# Patient Record
Sex: Male | Born: 1995 | Race: White | Hispanic: No | Marital: Single | Smoking: Current every day smoker
Health system: Southern US, Community
[De-identification: ages and names within clinical notes are randomized; demographics above are authoritative.]

---
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
## 2014-09-09 IMAGING — CT CT STONE STUDY
1 of 4 series · 6 of 46 positions shown, 11 images · non-contrast
Comparison: No priors.

CLINICAL DATA: Flank pain.  Vomiting.

EXAM:
CT ABDOMEN AND PELVIS WITHOUT CONTRAST
TECHNIQUE: Multidetector CT imaging of the abdomen and pelvis was performed
following the standard protocol without IV contrast.

[Series 4: lung · axial · 0.65mm/px · z∈[-806,-706]mm · 6 of 30 slices shown, 11 images]
[im 5/30  soft-tissue]
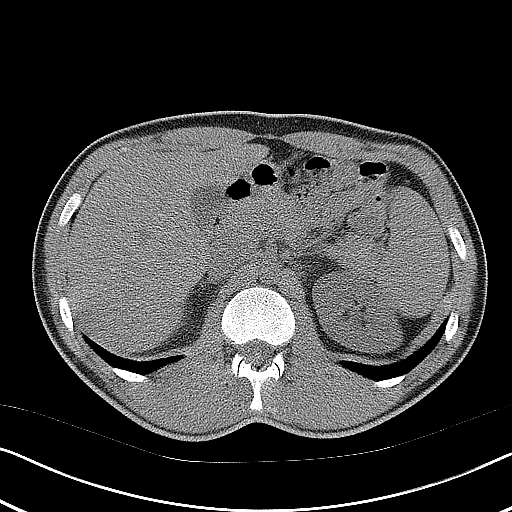
[im 5/30  bone]
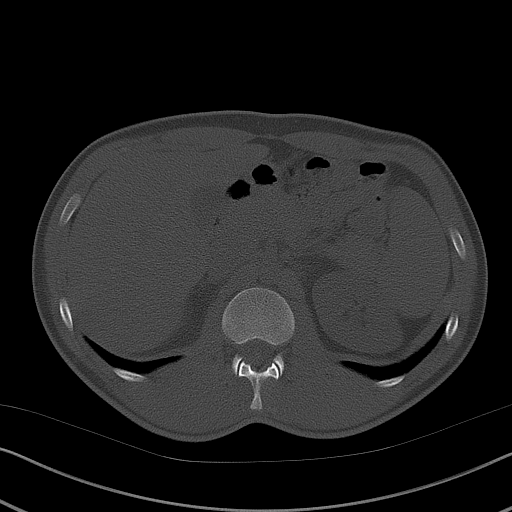
[im 9/30  soft-tissue]
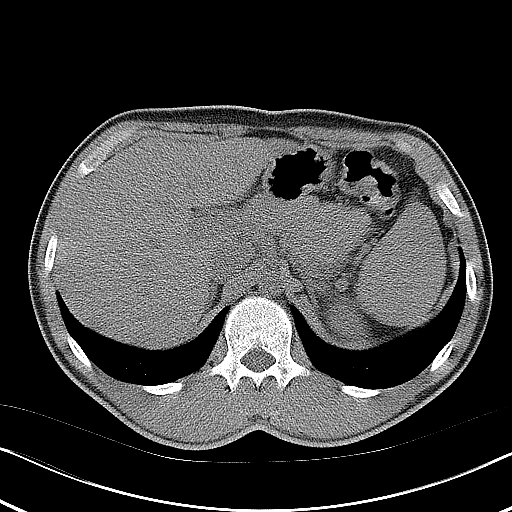
[im 13/30  soft-tissue]
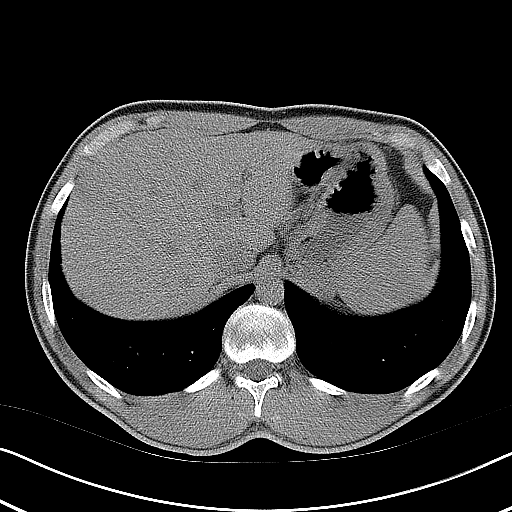
[im 13/30  lung]
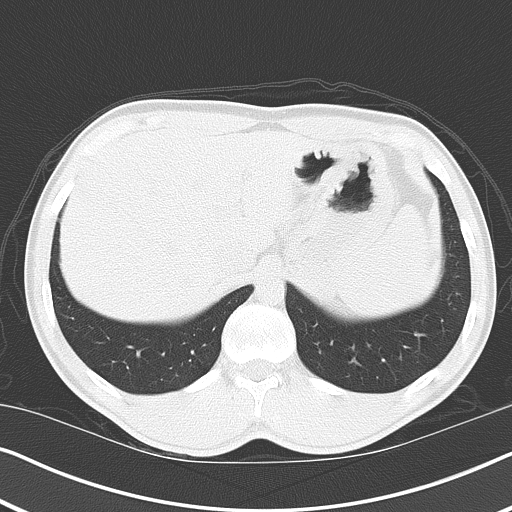
[im 17/30  soft-tissue]
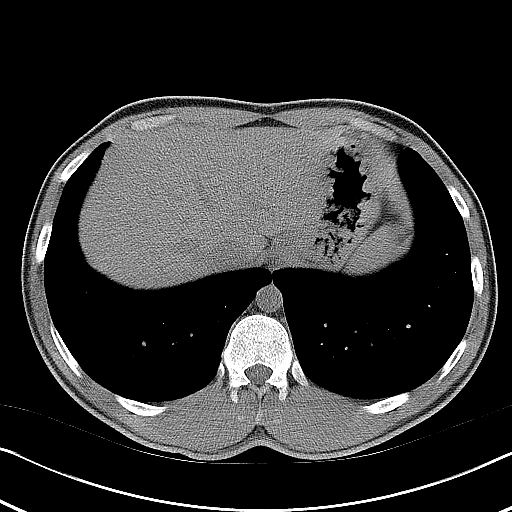
[im 17/30  lung]
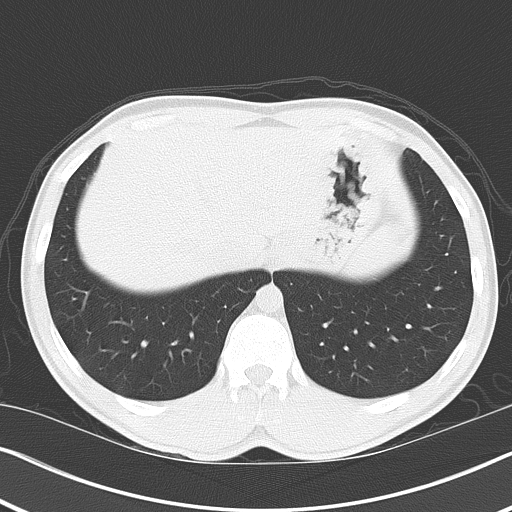
[im 21/30  soft-tissue]
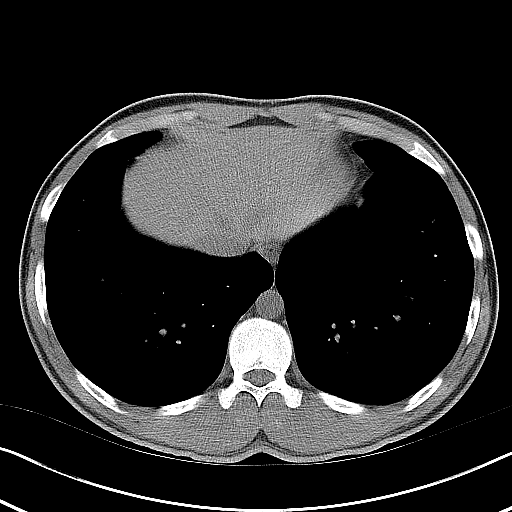
[im 21/30  lung]
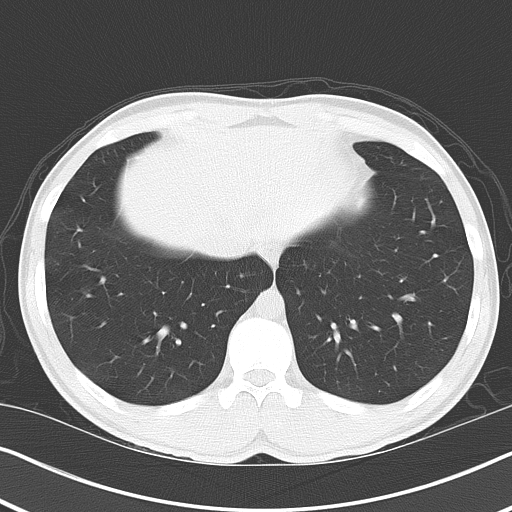
[im 25/30  soft-tissue]
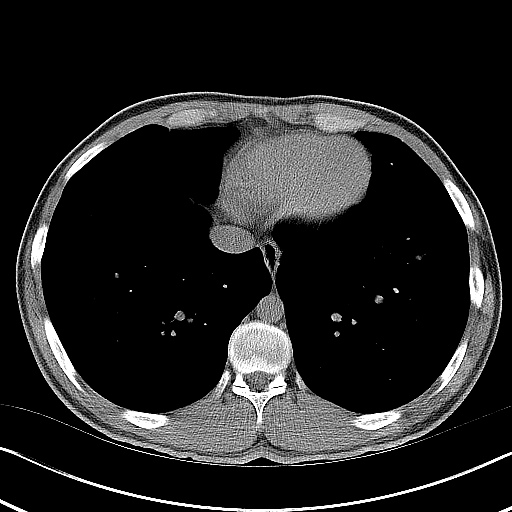
[im 25/30  lung]
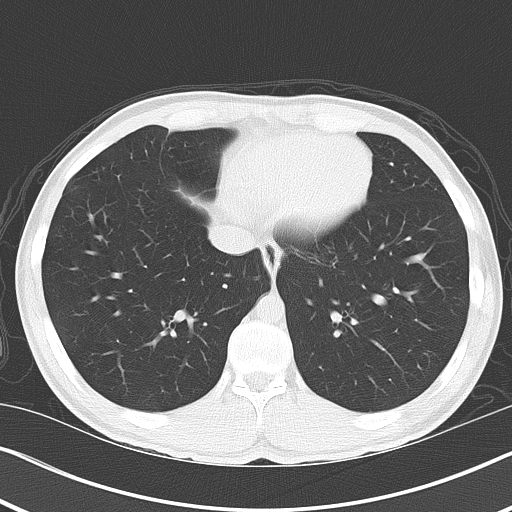

[6 of 46 positions shown; findings below may reference images not displayed]

FINDINGS: Lung Bases: Unremarkable.

Abdomen/Pelvis: In the upper pole collecting system of the left
kidney there is a 2 mm nonobstructive calculus. No additional
calculi are identified within the collecting system of the right
kidney, along the course of either ureter, or within the lumen of
the urinary bladder. No hydroureteronephrosis or perinephric
stranding to suggest urinary tract obstruction at this time. The
unenhanced appearance of the kidneys is otherwise unremarkable
bilaterally.

The unenhanced appearance of the liver, gallbladder, pancreas,
spleen and bilateral adrenal glands is unremarkable. No significant
volume of ascites. No pneumoperitoneum. No pathologic distention of
small bowel. No definite lymphadenopathy identified within the
abdomen or pelvis on today's non contrast CT examination.

Musculoskeletal: There are no aggressive appearing lytic or blastic
lesions noted in the visualized portions of the skeleton.
IMPRESSION: 1. 2 mm nonobstructive calculus in the upper pole collecting system
of the left kidney.
2. No ureteral stones or findings of urinary tract obstruction at
this time.
3. No acute findings in the abdomen or pelvis.

## 2014-10-20 ENCOUNTER — Emergency Department
Admission: EM | Admit: 2014-10-20 | Discharge: 2014-10-20 | Disposition: A | Discharge status: Other Acute Inpatient Hospital | Payer: Managed Care, Other (non HMO) | Attending: Emergency Medicine | Admitting: Emergency Medicine

## 2014-10-20 ENCOUNTER — Encounter: Payer: Self-pay | Admitting: Emergency Medicine

## 2014-10-20 DIAGNOSIS — F1210 Cannabis abuse, uncomplicated: Secondary | ICD-10-CM

## 2014-10-20 DIAGNOSIS — F1994 Other psychoactive substance use, unspecified with psychoactive substance-induced mood disorder: Secondary | ICD-10-CM | POA: Diagnosis not present

## 2014-10-20 DIAGNOSIS — F39 Unspecified mood [affective] disorder: Secondary | ICD-10-CM | POA: Insufficient documentation

## 2014-10-20 DIAGNOSIS — F1410 Cocaine abuse, uncomplicated: Secondary | ICD-10-CM | POA: Diagnosis not present

## 2014-10-20 DIAGNOSIS — Z008 Encounter for other general examination: Secondary | ICD-10-CM | POA: Diagnosis present

## 2014-10-20 DIAGNOSIS — R45851 Suicidal ideations: Secondary | ICD-10-CM | POA: Diagnosis not present

## 2014-10-20 DIAGNOSIS — Z720 Tobacco use: Secondary | ICD-10-CM | POA: Diagnosis not present

## 2014-10-20 LAB — CBC
HCT: 42.8 % (ref 40.0–52.0)
Hemoglobin: 15 g/dL (ref 13.0–18.0)
MCH: 32.5 pg (ref 26.0–34.0)
MCHC: 35 g/dL (ref 32.0–36.0)
MCV: 93 fL (ref 80.0–100.0)
Platelets: 256 10*3/uL (ref 150–440)
RBC: 4.61 MIL/uL (ref 4.40–5.90)
RDW: 12.4 % (ref 11.5–14.5)
WBC: 5.8 10*3/uL (ref 3.8–10.6)

## 2014-10-20 LAB — COMPREHENSIVE METABOLIC PANEL
ALT: 11 U/L — ABNORMAL LOW (ref 17–63)
AST: 18 U/L (ref 15–41)
Albumin: 4.7 g/dL (ref 3.5–5.0)
Alkaline Phosphatase: 49 U/L (ref 38–126)
Anion gap: 7 (ref 5–15)
BUN: 11 mg/dL (ref 6–20)
CO2: 28 mmol/L (ref 22–32)
Calcium: 9.7 mg/dL (ref 8.9–10.3)
Chloride: 106 mmol/L (ref 101–111)
Creatinine, Ser: 0.93 mg/dL (ref 0.61–1.24)
GFR calc Af Amer: >60 mL/min (ref >60)
GFR calc non Af Amer: >60 mL/min (ref >60)
Glucose, Bld: 100 mg/dL — ABNORMAL HIGH (ref 65–99)
Potassium: 4.4 mmol/L (ref 3.5–5.1)
Sodium: 141 mmol/L (ref 135–145)
Total Bilirubin: 0.9 mg/dL (ref 0.3–1.2)
Total Protein: 8 g/dL (ref 6.5–8.1)

## 2014-10-20 LAB — URINE DRUG SCREEN, QUALITATIVE (ARMC ONLY)
Amphetamines, Ur Screen: NOT DETECTED
Barbiturates, Ur Screen: NOT DETECTED
Benzodiazepine, Ur Scrn: NOT DETECTED
Cannabinoid 50 Ng, Ur ~~LOC~~: POSITIVE — AB
Cocaine Metabolite,Ur ~~LOC~~: POSITIVE — AB
MDMA (Ecstasy)Ur Screen: NOT DETECTED
Methadone Scn, Ur: NOT DETECTED
Opiate, Ur Screen: NOT DETECTED
Phencyclidine (PCP) Ur S: NOT DETECTED
Tricyclic, Ur Screen: NOT DETECTED

## 2014-10-20 LAB — ACETAMINOPHEN LEVEL: Acetaminophen (Tylenol), Serum: <10 ug/mL — ABNORMAL LOW (ref 10–30)

## 2014-10-20 LAB — SALICYLATE LEVEL: Salicylate Lvl: <4 mg/dL (ref 2.8–30.0)

## 2014-10-20 LAB — ETHANOL: Alcohol, Ethyl (B): <5 mg/dL (ref <5)

## 2014-10-20 MED ORDER — NICOTINE 21 MG/24HR TD PT24
21.0000 mg | MEDICATED_PATCH | Freq: Every day | TRANSDERMAL | Status: DC
  Administered 2014-10-20: 21 mg via TRANSDERMAL
  Filled 2014-10-20: qty 1

## 2014-10-20 NOTE — ED Notes (Signed)
Pt being discharge per MD orders.

## 2014-10-20 NOTE — ED Notes (Signed)
Pt mom took his wallet and car keys and cell phone home with her at time of triage.

## 2014-10-20 NOTE — ED Notes (Signed)
BEHAVIORAL HEALTH ROUNDING Patient sleeping: No. Patient alert and oriented: yes Behavior appropriate: Yes.  ; If no, describe:  Nutrition and fluids offered: Yes  and Pt has not been evaluated by EDP. Toileting and hygiene offered: Yes  Sitter present: yes Law enforcement present: Yes ODS   Pt has 1:1 sitter at this time d/t vocalizing SI.

## 2014-10-20 NOTE — BHH Counselor (Signed)
Writer received a call from Pt's father, Jenetta DownerJohn Johnstone [phone]), wanting to know if his son was in the ER and information about him.  Writer has not received consent from Pt on talking to his father, so he was given no information.  Writer informed Pt's nurse, Aundra MilletMegan, RN to let the Pt know his father was asking about him.   Anne ShutterHannah Fontella Shan, LPCA Therapeutic Triage Specialist

## 2014-10-20 NOTE — ED Notes (Signed)

## 2014-10-20 NOTE — ED Notes (Signed)
BEHAVIORAL HEALTH ROUNDING Patient sleeping: No. Patient alert and oriented: yes Behavior appropriate: Yes.  ; If no, describe:  Nutrition and fluids offered: Yes  Toileting and hygiene offered: Yes  Sitter present: yes Law enforcement present: No 

## 2014-10-20 NOTE — ED Notes (Signed)
Patient assigned to appropriate care area. Patient oriented to unit/care area: Informed that, for their safety, care areas are designed for safety and monitored by security cameras at all times; and visiting hours explained to patient. Patient verbalizes understanding, and verbal contract for safety obtained. 
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
## 2014-10-20 NOTE — ED Notes (Signed)

## 2014-10-20 NOTE — ED Notes (Signed)
Pt states that he is here today "to get clean" pt states he is using approx .5-1gram of cocaine 4 times a week for the past year. Pt states he "uses to feel numb". Pt states that he has thoughts about hurting himself with no particular plan. NAD noted at time of assessment. Pt calm and cooperative with staff, some anxiety noted on assessment.

## 2014-10-20 NOTE — ED Notes (Signed)
NAD noted at this time. Pt resting in bed with his eyes closed, awakens with mild stimuli. Respirations are noted to be even and unlabored at this time. Will continue with 15 min safety checks.

## 2014-10-20 NOTE — Discharge Instructions (Signed)
Stimulant Use Disorder-Cocaine  Cocaine is one of a group of powerful drugs called stimulants. Cocaine has medical uses for stopping nosebleeds and for pain control before minor nose or dental surgery. However, cocaine is misused because of the effects that it produces. These effects include:   · A feeling of extreme pleasure.  · Alertness.  · High energy.  Common street names for cocaine include coke, crack, blow, snow, and nose candy. Cocaine is snorted, dissolved in water and injected, or smoked.   Stimulants are addictive because they activate regions of the brain that produce both the pleasurable sensation of "reward" and psychological dependence. Together, these actions account for loss of control and the rapid development of drug dependence. This means you become ill without the drug (withdrawal) and need to keep using it to function.   Stimulant use disorder is use of stimulants that disrupts your daily life. It disrupts relationships with family and friends and how you do your job. Cocaine increases your blood pressure and heart rate. It can cause a heart attack or stroke. Cocaine can also cause death from irregular heart rate or seizures.  SYMPTOMS  Symptoms of stimulant use disorder with cocaine include:  · Use of cocaine in larger amounts or over a longer period of time than intended.  · Unsuccessful attempts to cut down or control cocaine use.  · A lot of time spent obtaining, using, or recovering from the effects of cocaine.  · A strong desire or urge to use cocaine (craving).  · Continued use of cocaine in spite of major problems at work, school, or home because of use.  · Continued use of cocaine in spite of relationship problems because of use.  · Giving up or cutting down on important life activities because of cocaine use.  · Use of cocaine over and over in situations when it is physically hazardous, such as driving a car.  · Continued use of cocaine in spite of a physical problem that is likely  related to use. Physical problems can include:  ¨ Malnutrition.  ¨ Nosebleeds.  ¨ Chest pain.  ¨ High blood pressure.  ¨ A hole that develops between the part of your nose that separates your nostrils (perforated nasal septum).  ¨ Lung and kidney damage.  · Continued use of cocaine in spite of a mental problem that is likely related to use. Mental problems can include:  ¨ Schizophrenia-like symptoms.  ¨ Depression.  ¨ Bipolar mood swings.  ¨ Anxiety.  ¨ Sleep problems.  · Need to use more and more cocaine to get the same effect, or lessened effect over time with use of the same amount of cocaine (tolerance).  · Having withdrawal symptoms when cocaine use is stopped, or using cocaine to reduce or avoid withdrawal symptoms. Withdrawal symptoms include:  ¨ Depressed or irritable mood.  ¨ Low energy or restlessness.  ¨ Bad dreams.  ¨ Poor or excessive sleep.  ¨ Increased appetite.  DIAGNOSIS  Stimulant use disorder is diagnosed by your health care provider. You may be asked questions about your cocaine use and how it affects your life. A physical exam may be done. A drug screen may be ordered. You may be referred to a mental health professional. The diagnosis of stimulant use disorder requires at least two symptoms within 12 months. The type of stimulant use disorder depends on the number of signs and symptoms you have. The type may be:  · Mild. Two or three signs and symptoms.  ·   Moderate. Four or five signs and symptoms.  · Severe. Six or more signs and symptoms.  TREATMENT  Treatment for stimulant use disorder is usually provided by mental health professionals with training in substance use disorders. The following options are available:  · Counseling or talk therapy. Talk therapy addresses the reasons you use cocaine and ways to keep you from using again. Goals of talk therapy include:  ¨ Identifying and avoiding triggers for use.  ¨ Handling cravings.  ¨ Replacing use with healthy activities.  · Support groups.  Support groups provide emotional support, advice, and guidance.  · Medicine. Certain medicines may decrease cocaine cravings or withdrawal symptoms.  HOME CARE INSTRUCTIONS  · Take medicines only as directed by your health care provider.  · Identify the people and activities that trigger your cocaine use and avoid them.  · Keep all follow-up visits as directed by your health care provider.  SEEK MEDICAL CARE IF:  · Your symptoms get worse or you relapse.  · You are not able to take medicines as directed.  SEEK IMMEDIATE MEDICAL CARE IF:  · You have serious thoughts about hurting yourself or others.  · You have a seizure, chest pain, sudden weakness, or loss of speech or vision.  FOR MORE INFORMATION  · National Institute on Drug Abuse: www.drugabuse.gov  · Substance Abuse and Mental Health Services Administration: www.samhsa.gov     This information is not intended to replace advice given to you by your health care provider. Make sure you discuss any questions you have with your health care provider.     Document Released: 12/23/1999 Document Revised: 01/15/2014 Document Reviewed: 01/07/2013  [employer]vier Interactive Patient Education ©2016 [employer]
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
## 2014-10-20 NOTE — ED Notes (Signed)
Pt to discharge to home  

## 2014-10-20 NOTE — ED Notes (Signed)
Pt transferred into ED BHU room 2    Patient assigned to appropriate care area. Patient oriented to unit/care area: Informed that, for their safety, care areas are designed for safety and monitored by security cameras at all times; Visiting hours and phone times explained to patient. Patient verbalizes understanding, and verbal contract for safety obtained.    Assessment completed  - he denies pain

## 2014-10-20 NOTE — ED Notes (Signed)
ED BHU PLACEMENT JUSTIFICATION Is the patient under IVC or is there intent for IVC: Yes.   Is the patient medically cleared: Yes.   Is there vacancy in the ED BHU: Yes.   Is the population mix appropriate for patient: Yes.   Is the patient awaiting placement in inpatient or outpatient setting: Yes.? Placement or discharge to home     Has the patient had a psychiatric consult: Yes.   Survey of unit performed for contraband, proper placement and condition of furniture, tampering with fixtures in bathroom, shower, and each patient room: Yes.  ; Findings:  APPEARANCE/BEHAVIOR Calm and cooperative NEURO ASSESSMENT Orientation: oriented x3  Denies pain Hallucinations: No.None noted (Hallucinations) Speech: Normal Gait: normal RESPIRATORY ASSESSMENT Even  Unlabored respirations  CARDIOVASCULAR ASSESSMENT Pulses equal   regular rate  Skin warm and dry   GASTROINTESTINAL ASSESSMENT no GI complaint EXTREMITIES Full ROM  PLAN OF CARE Provide calm/safe environment. Vital signs assessed twice daily. ED BHU Assessment once each 12-hour shift. Collaborate with intake RN daily or as condition indicates. Assure the ED provider has rounded once each shift. Provide and encourage hygiene. Provide redirection as needed. Assess for escalating behavior; address immediately and inform ED provider.  Assess family dynamic and appropriateness for visitation as needed: Yes.  ; If necessary, describe findings:  Educate the patient/family about BHU procedures/visitation: Yes.  ; If necessary, describe findings:

## 2014-10-20 NOTE — ED Notes (Signed)
Pt here voluntary for drug problem. Pt uses pot and cocaine and has been using for one year now. Pt does state that has suicidal thoughts but not plan and denies any HI.  Pt brought in by his mother. Pt last used cocaine today.

## 2014-10-20 NOTE — ED Notes (Signed)
NAD noted at this time. Pt resting in bed with his eyes closed, respirations even and unlabored. Awakens with mild stimuli at this time. Will continue 15 min safety checks.

## 2014-10-20 NOTE — ED Provider Notes (Signed)
-----------------------------------------   6:58 PM on 10/20/2014 -----------------------------------------   Blood pressure 109/70, pulse 98, temperature 98.2 F (36.8 C), temperature source Oral, resp. rate 18, height 5\' 9"  (1.753 m), weight 140 lb (63.504 kg), SpO2 98 %.  The patient had no acute events since last update.  Calm and cooperative at this time. He is clinically sober at this time. Is not suicidal or homicidal. Was violated by Dr. Toni Amendlapacs of the psychiatry service who could the patient for discharge. The [facility]. He was given follow-up information by the behavioral health intake.    Myrna Blazeravid Matthew Tremain Rucinski, MD 10/20/14 [phone]

## 2014-10-20 NOTE — ED Provider Notes (Signed)
[facility] Emergency Department Provider Note  ____________________________________________   I have reviewed the triage vital signs and the nursing notes.   HISTORY  Chief Complaint Psychiatric Evaluation    HPI KITT LEDET is a 19 y.o. male with a history of substance abuse presents today with suicidal thoughts. He states that he is thinking about hurting himself. He has no direct plan and has never tried to hurt himself before. Patient does use cocaine occasionally drinks. No history of prior suicide attempt no history of HI. Does not have a current HI thoughts. He states his cousin committed suicide recently and he himself recently moved.  History reviewed. No pertinent past medical history.  There are no active problems to display for this patient.   History reviewed. No pertinent past surgical history.  No current outpatient prescriptions on file.  Allergies Review of patient's allergies indicates no known allergies.  No family history on file.  Social History Social History  Substance Use Topics  . Smoking status: Current Every Day Smoker  . Smokeless tobacco: None  . Alcohol Use: Yes    Review of Systems Constitutional: No fever/chills Eyes: No visual changes. ENT: No sore throat. No stiff neck no neck pain Cardiovascular: Denies chest pain. Respiratory: Denies shortness of breath. Gastrointestinal:   no vomiting.  No diarrhea.  No constipation. Genitourinary: Negative for dysuria. Musculoskeletal: Negative lower extremity swelling Skin: Negative for rash. Neurological: Negative for headaches, focal weakness or numbness. 10-point ROS otherwise negative.  ____________________________________________   PHYSICAL EXAM:  VITAL SIGNS: ED Triage Vitals  Enc Vitals Group     BP 10/20/14 1336 109/70 mmHg     Pulse Rate 10/20/14 1336 98     Resp 10/20/14 1336 18     Temp 10/20/14 1336 98.2 F (36.8 C)     Temp Source 10/20/14  1336 Oral     SpO2 10/20/14 1336 98 %     Weight 10/20/14 1336 140 lb (63.504 kg)     Height 10/20/14 1336  (1.753 m)     Head Cir --      Peak Flow --      Pain Score --      Pain Loc --      Pain Edu? --      Excl. in GC? --     Constitutional: Alert and oriented. Well appearing and in no acute distress. Eyes: Conjunctivae are normal. PERRL. EOMI. Head: Atraumatic. Nose: No congestion/rhinnorhea. Mouth/Throat: Mucous membranes are moist.  Oropharynx non-erythematous. Neck: No stridor.   Nontender with no meningismus Cardiovascular: Normal rate, regular rhythm. Grossly normal heart sounds.  Good peripheral circulation. Respiratory: Normal respiratory effort.  No retractions. Lungs CTAB. Gastrointestinal: Soft and nontender. No distention. No guarding no rebound Back:  There is no focal tenderness or step off there is no midline tenderness there are no lesions noted. there is no CVA tenderness Musculoskeletal: No lower extremity tenderness. No joint effusions, no DVT signs strong distal pulses no edema Neurologic:  Normal speech and language. No gross focal neurologic deficits are appreciated.  Skin:  Skin is warm, dry and intact. No rash noted. Psychiatric: Mood and affect are normal. Speech and behavior are normal.  ____________________________________________   LABS (all labs ordered are listed, but only abnormal results are displayed)  Labs Reviewed  COMPREHENSIVE METABOLIC PANEL - Abnormal; Notable for the following:    Glucose, Bld 100 (*)    ALT 11 (*)    All other components within  normal limits  URINE DRUG SCREEN, QUALITATIVE ([facility] ONLY) - Abnormal; Notable for the following:    Cocaine Metabolite,Ur [place] POSITIVE (*)    Cannabinoid 50 Ng, Ur [place] POSITIVE (*)    All other components within normal limits  CBC  ETHANOL  SALICYLATE LEVEL  ACETAMINOPHEN LEVEL    ____________________________________________  EKG   ____________________________________________  RADIOLOGY   ____________________________________________   PROCEDURES  Procedure(s) performed: None  Critical Care performed: None  ____________________________________________   INITIAL IMPRESSION / ASSESSMENT AND PLAN / ED COURSE  Pertinent labs & imaging results that were available during my care of the patient were reviewed by me and considered in my medical decision making (see chart for details).  Patient here with suicidal thoughts without plan, mediated to some degree by polysubstance abuse. He will be evaluated by psychiatry. No acute evidence of overdose or other acute medical concern ____________________________________________   FINAL CLINICAL IMPRESSION(S) / ED DIAGNOSES  Final diagnoses:  None     Jeanmarie PlantJames A Tino Ronan, MD 10/20/14 1517

## 2019-03-26 ENCOUNTER — Ambulatory Visit: Payer: Managed Care, Other (non HMO)

## 2019-03-27 ENCOUNTER — Ambulatory Visit: Payer: Commercial Managed Care - PPO | Attending: Internal Medicine

## 2019-03-27 DIAGNOSIS — Z23 Encounter for immunization: Secondary | ICD-10-CM

## 2019-03-27 NOTE — Progress Notes (Signed)
   Covid-19 Vaccination Clinic  Name:  Brian Cruz    MRN: [id number] DOB: [birth year]  03/27/2019  Mr. Erazo was observed post Covid-19 immunization for 15 minutes without incident. He was provided with Vaccine Information Sheet and instruction to access the V-Safe system.   Mr. Cass was instructed to call 911 with any severe reactions post vaccine: Marland Kitchen Difficulty breathing  . Swelling of face and throat  . A fast heartbeat  . A bad rash all over body  . Dizziness and weakness   Immunizations Administered    Name Date Dose VIS Date Route   Pfizer COVID-19 Vaccine 03/27/2019  9:24 AM 0.3 mL 12/19/2018 Intramuscular   Manufacturer: [employer], [employer]   Lot: [id number]   NDC: [address]-1

## 2019-04-21 ENCOUNTER — Ambulatory Visit: Payer: Commercial Managed Care - PPO | Attending: Internal Medicine

## 2019-04-21 DIAGNOSIS — Z23 Encounter for immunization: Secondary | ICD-10-CM

## 2019-04-21 NOTE — Progress Notes (Signed)
   Covid-19 Vaccination Clinic  Name:  Brian Cruz    MRN: [id number] DOB: 1995-01-16  04/21/2019  [name] was observed post Covid-19 immunization for 15 minutes without incident. He was provided with Vaccine Information Sheet and instruction to access the V-Safe system.   [name] was instructed to call 911 with any severe reactions post vaccine: Marland Kitchen Difficulty breathing  . Swelling of face and throat  . A fast heartbeat  . A bad rash all over body  . Dizziness and weakness   Immunizations Administered    Name Date Dose VIS Date Route   Pfizer COVID-19 Vaccine 04/21/2019 10:21 AM 0.3 mL 12/19/2018 Intramuscular   Manufacturer: [employer], [employer]   Lot: [id number]   NDC: [address]-5
